# Patient Record
Sex: Male | Born: 2000 | Race: White | Hispanic: No | Marital: Single
Health system: Southern US, Community
[De-identification: ages and names within clinical notes are randomized; demographics above are authoritative.]

## PROBLEM LIST (undated history)

## (undated) DIAGNOSIS — R278 Other lack of coordination: Secondary | ICD-10-CM

## (undated) DIAGNOSIS — F902 Attention-deficit hyperactivity disorder, combined type: Secondary | ICD-10-CM

## (undated) DIAGNOSIS — F909 Attention-deficit hyperactivity disorder, unspecified type: Secondary | ICD-10-CM

## (undated) HISTORY — DX: Attention-deficit hyperactivity disorder, combined type: F90.2

## (undated) HISTORY — PX: WISDOM TOOTH EXTRACTION: SHX21

## (undated) HISTORY — DX: Other lack of coordination: R27.8

---
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
## 2020-11-16 DIAGNOSIS — H6692 Otitis media, unspecified, left ear: Secondary | ICD-10-CM | POA: Diagnosis not present

## 2020-11-16 DIAGNOSIS — H6982 Other specified disorders of Eustachian tube, left ear: Secondary | ICD-10-CM | POA: Diagnosis not present

## 2020-11-24 DIAGNOSIS — H60311 Diffuse otitis externa, right ear: Secondary | ICD-10-CM | POA: Diagnosis not present

## 2020-11-24 DIAGNOSIS — H66003 Acute suppurative otitis media without spontaneous rupture of ear drum, bilateral: Secondary | ICD-10-CM | POA: Diagnosis not present

## 2021-10-20 DIAGNOSIS — R21 Rash and other nonspecific skin eruption: Secondary | ICD-10-CM | POA: Diagnosis not present

## 2021-10-20 DIAGNOSIS — R051 Acute cough: Secondary | ICD-10-CM | POA: Diagnosis not present

## 2021-11-06 DIAGNOSIS — R21 Rash and other nonspecific skin eruption: Secondary | ICD-10-CM | POA: Diagnosis not present

## 2021-11-06 DIAGNOSIS — J208 Acute bronchitis due to other specified organisms: Secondary | ICD-10-CM | POA: Diagnosis not present
